# Patient Record
Sex: Female | Born: 1935 | Race: White | Hispanic: No | Marital: Married | Smoking: Never smoker
Health system: Southern US, Community
[De-identification: ages and names within clinical notes are randomized; demographics above are authoritative.]

## PROBLEM LIST (undated history)

## (undated) DIAGNOSIS — I10 Essential (primary) hypertension: Secondary | ICD-10-CM

## (undated) DIAGNOSIS — N289 Disorder of kidney and ureter, unspecified: Secondary | ICD-10-CM

## (undated) DIAGNOSIS — K5790 Diverticulosis of intestine, part unspecified, without perforation or abscess without bleeding: Secondary | ICD-10-CM

## (undated) DIAGNOSIS — E119 Type 2 diabetes mellitus without complications: Secondary | ICD-10-CM

## (undated) HISTORY — PX: CARDIAC SURGERY: SHX584

## (undated) HISTORY — PX: ABDOMINAL HYSTERECTOMY: SHX81

---

## 2003-11-12 ENCOUNTER — Other Ambulatory Visit: Payer: Self-pay

## 2004-01-24 ENCOUNTER — Ambulatory Visit: Payer: Self-pay | Admitting: Internal Medicine

## 2004-03-26 ENCOUNTER — Encounter: Payer: Self-pay | Admitting: Internal Medicine

## 2004-04-10 ENCOUNTER — Encounter: Payer: Self-pay | Admitting: Internal Medicine

## 2004-12-02 ENCOUNTER — Ambulatory Visit: Payer: Self-pay | Admitting: Internal Medicine

## 2005-02-03 ENCOUNTER — Ambulatory Visit: Payer: Self-pay | Admitting: Gastroenterology

## 2005-03-26 ENCOUNTER — Ambulatory Visit: Payer: Self-pay | Admitting: Internal Medicine

## 2005-12-08 ENCOUNTER — Ambulatory Visit: Payer: Self-pay | Admitting: Internal Medicine

## 2006-09-22 ENCOUNTER — Ambulatory Visit: Payer: Self-pay | Admitting: Family Medicine

## 2007-01-07 ENCOUNTER — Ambulatory Visit: Payer: Self-pay | Admitting: Family Medicine

## 2007-07-21 ENCOUNTER — Ambulatory Visit: Payer: Self-pay | Admitting: Unknown Physician Specialty

## 2007-07-31 ENCOUNTER — Emergency Department: Payer: Self-pay | Admitting: Internal Medicine

## 2007-09-07 ENCOUNTER — Ambulatory Visit: Payer: Self-pay | Admitting: Unknown Physician Specialty

## 2008-01-13 ENCOUNTER — Ambulatory Visit: Payer: Self-pay | Admitting: Family Medicine

## 2008-01-31 ENCOUNTER — Ambulatory Visit: Payer: Self-pay | Admitting: Family Medicine

## 2008-03-17 ENCOUNTER — Inpatient Hospital Stay: Payer: Self-pay | Admitting: Internal Medicine

## 2008-03-30 ENCOUNTER — Ambulatory Visit: Payer: Self-pay | Admitting: Unknown Physician Specialty

## 2009-02-16 IMAGING — CT CT CERVICAL SPINE WITHOUT CONTRAST
3 series · 16 of 33 positions shown, 19 images · non-contrast
Comparison: none

REASON FOR EXAM: Fall
COMMENTS:

[Series 2: axial · axial · 0.30mm/px · z∈[+122,+240]mm · 8 of 81 slices shown, 10 images]
[im 7/81  soft-tissue]
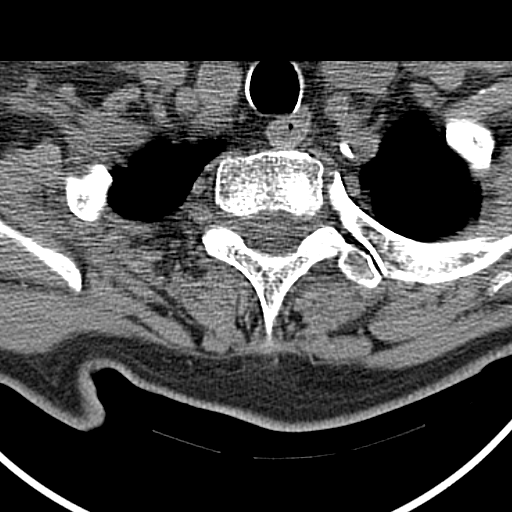
[im 7/81  bone]
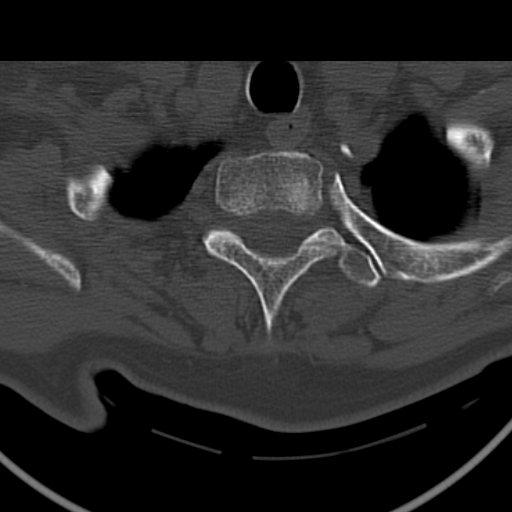
[im 19/81  bone]
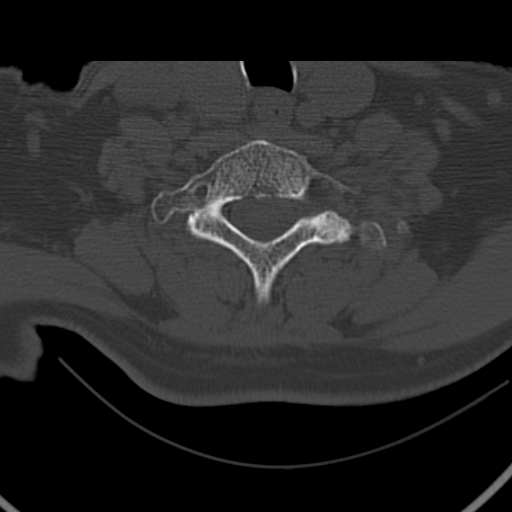
[im 25/81  bone]
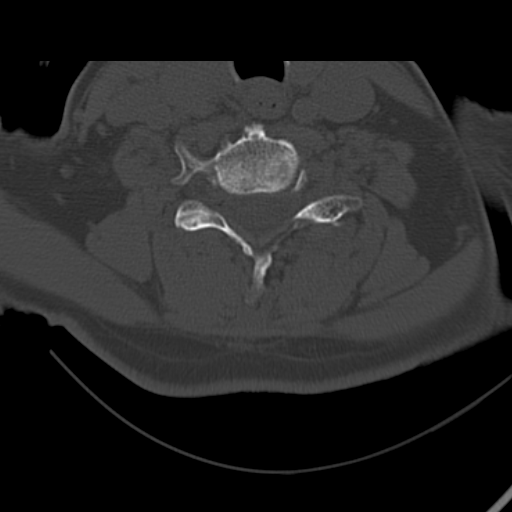
[im 37/81  bone]
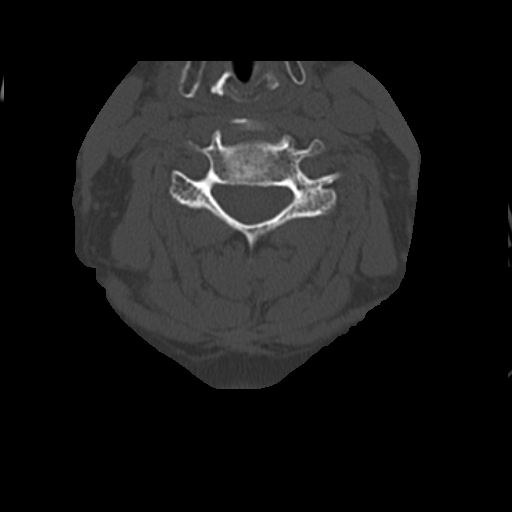
[im 44/81  soft-tissue]
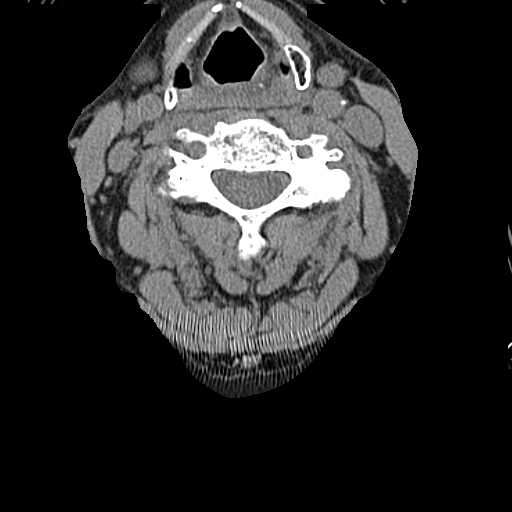
[im 44/81  bone]
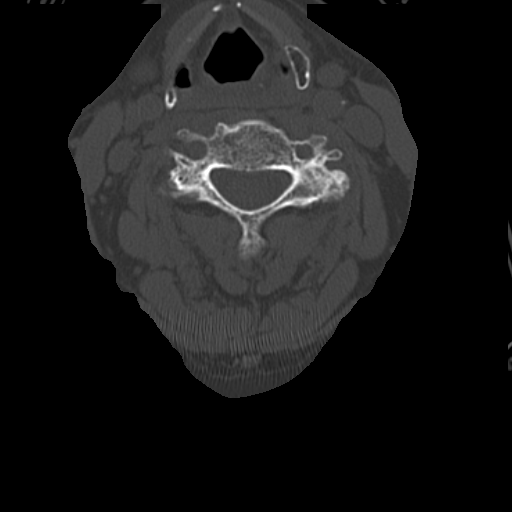
[im 56/81  bone]
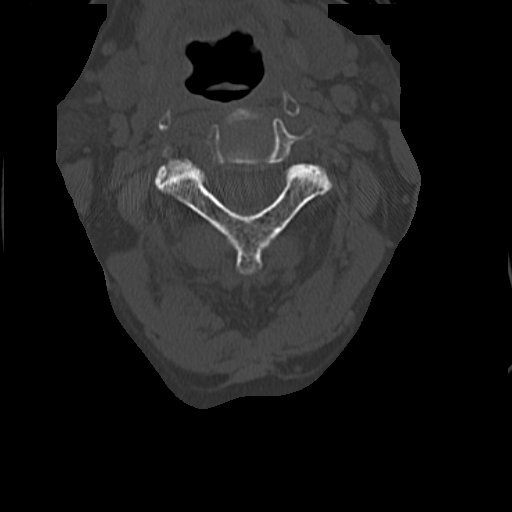
[im 62/81  bone]
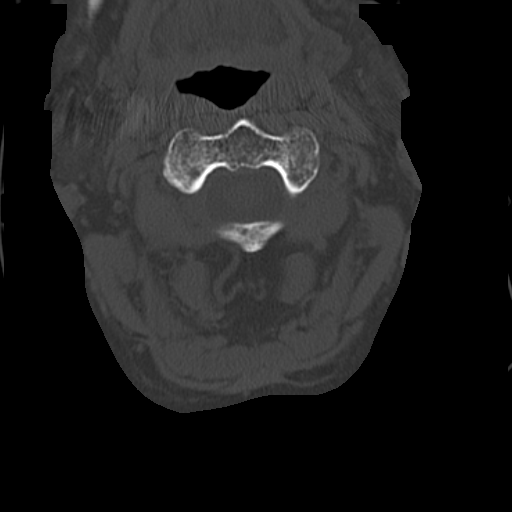
[im 74/81  bone]
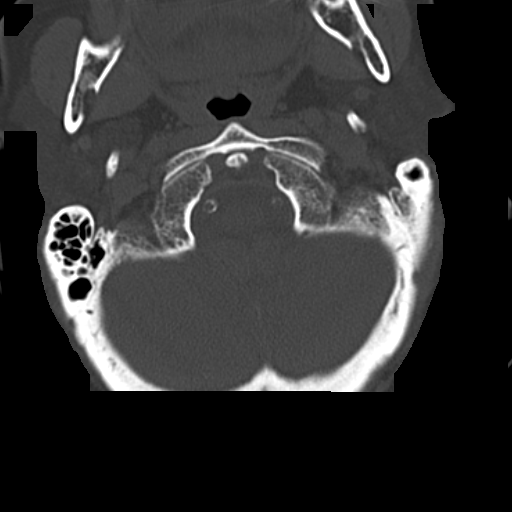

[Series 3: sagittal · sagittal · 0.33mm/px · 5 of 50 slices shown, 6 images]
[im 17/50  bone]
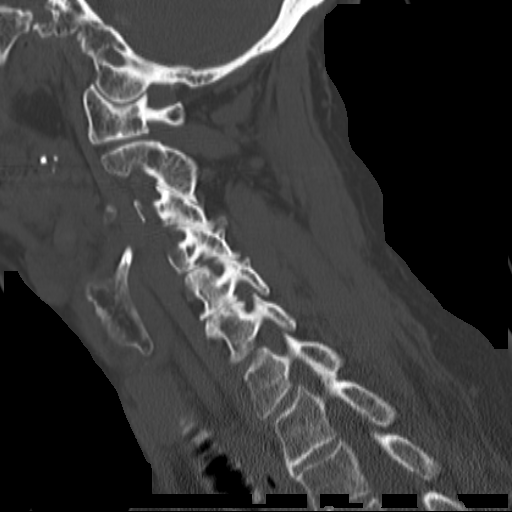
[im 21/50  bone]
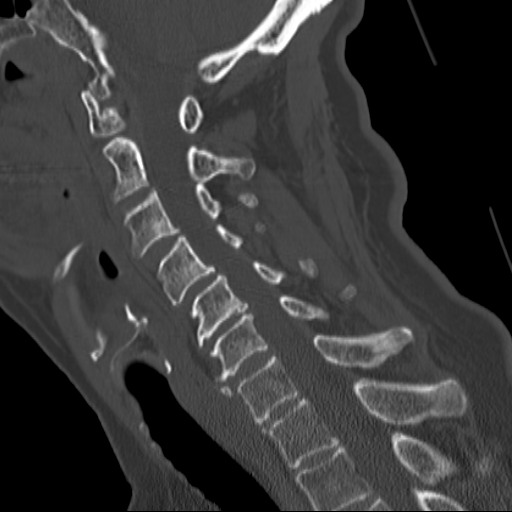
[im 25/50  soft-tissue]
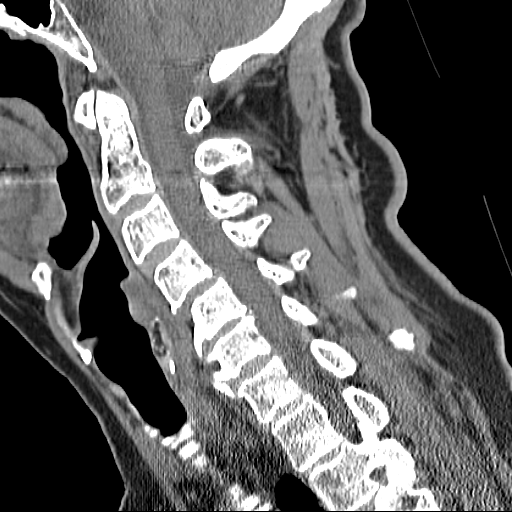
[im 25/50  bone]
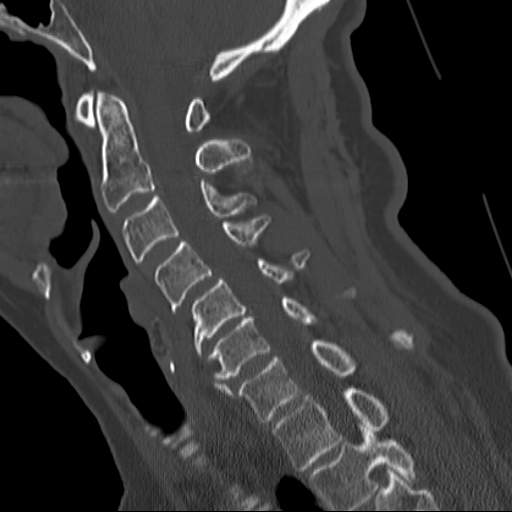
[im 29/50  bone]
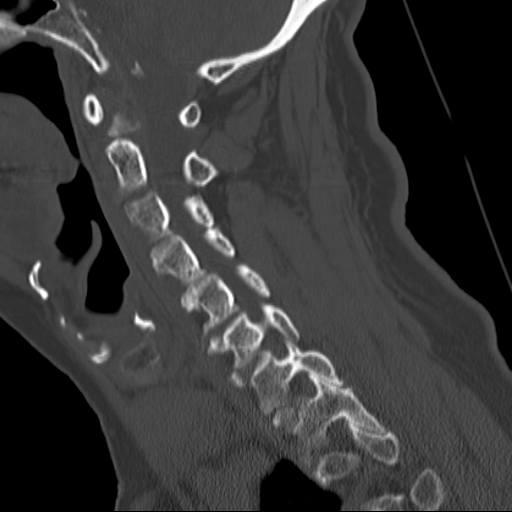
[im 33/50  bone]
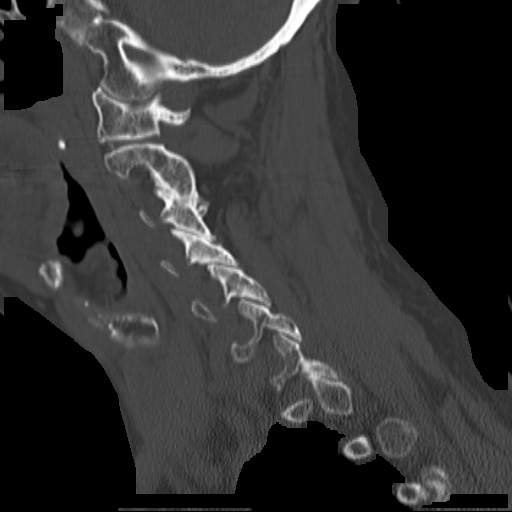

[Series 4: coronal · coronal · 0.33mm/px · 3 of 52 slices shown]
[im 11/52  bone]
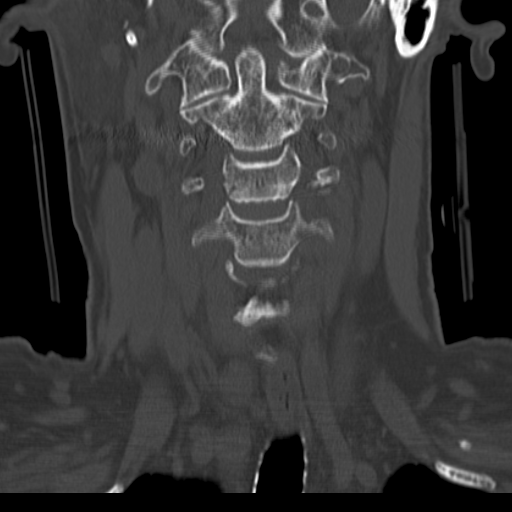
[im 21/52  bone]
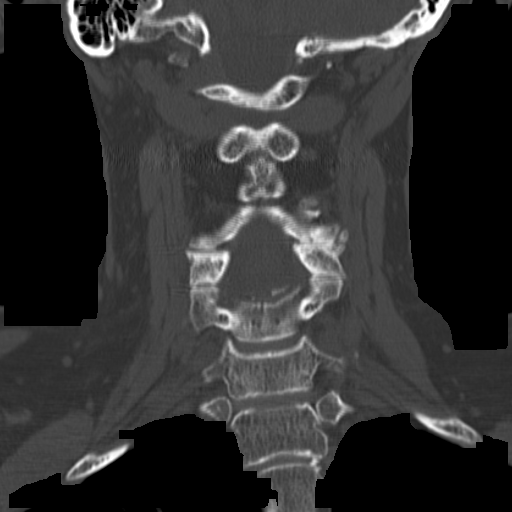
[im 31/52  bone]
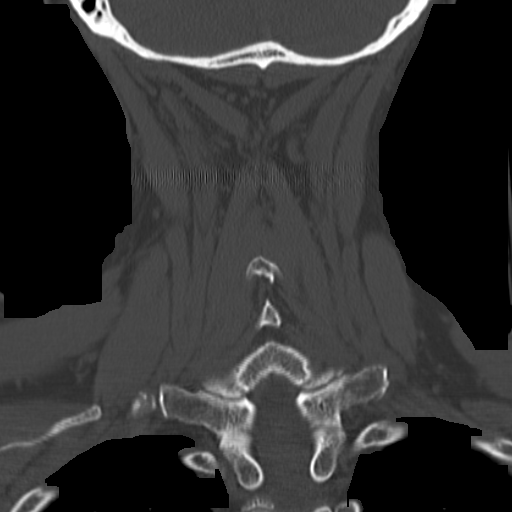

[16 of 33 positions shown; findings below may reference images not displayed]

PROCEDURE:     CT  - CT CERVICAL SPINE WO  - July 31, 2007 [DATE]

RESULT:     There does not appear to be evidence of acute fracture or
dislocation.  There is straightening of the normal cervical lordosis
possibly secondary to collar placement and/or muscle spasm.  Degenerative
change is appreciated within the mid cervical spine at the C5-6 and C6-7
levels.  There are areas of osteophytosis. No evidence of prevertebral soft
tissue swelling is appreciated.
IMPRESSION: 1.Degenerative changes without evidence of acute osseous abnormalities.

Dr. Sunbul of the Emergency Room was informed of these findings at the
time of the initial interpretation.

## 2009-02-16 IMAGING — CT CT MAXILLOFACIAL WITHOUT CONTRAST
1 series · 16 of 30 positions shown, 20 images · non-contrast
Comparison: none

REASON FOR EXAM: Fall
COMMENTS:

PROCEDURE:     CT  - CT MAXILLOFACIAL AREA WO  - July 31, 2007 [DATE]
RESULT:     There is no evidence of fracture, dislocation or malalignment.
The paranasal sinuses are patent.  The ostiomeatal complexes demonstrate
concha bullosa within the superior turbinate on the RIGHT.

[Series 2: facial 3.0 h60f · axial · 0.34mm/px · z∈[+194,+356]mm · 16 of 58 slices shown, 20 images]
[im 2/58  brain]
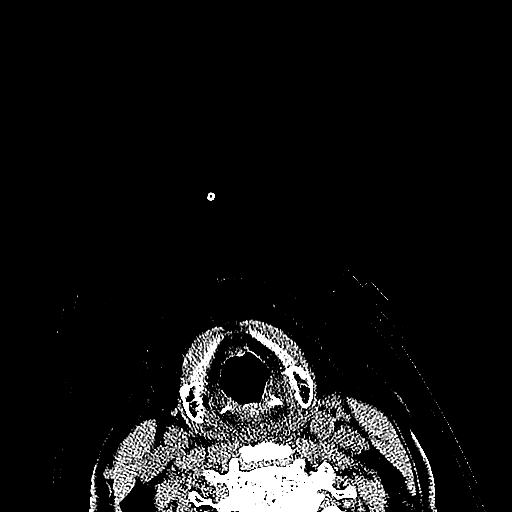
[im 2/58  bone]
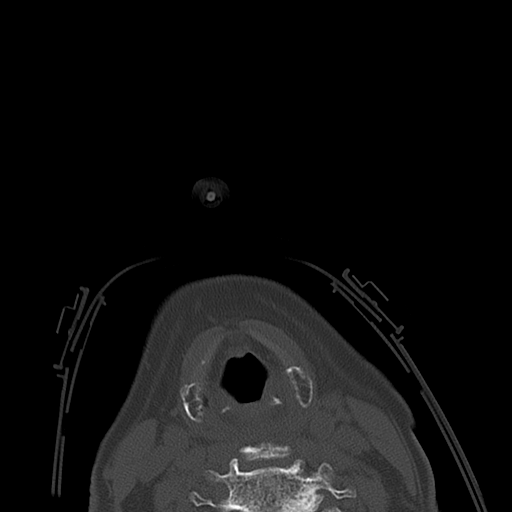
[im 6/58  bone]
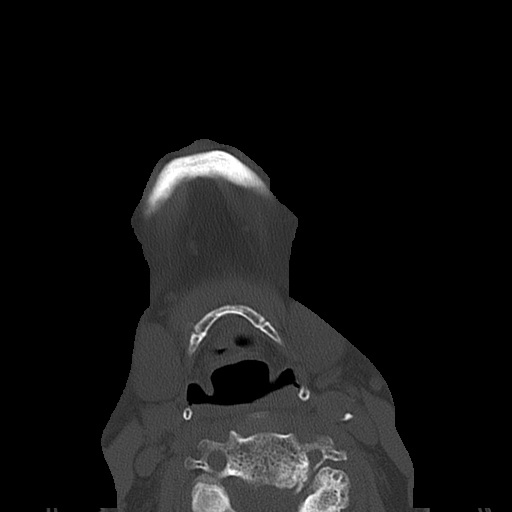
[im 10/58  bone]
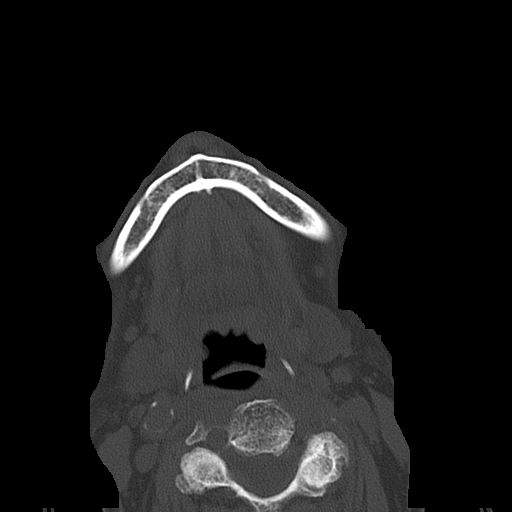
[im 14/58  bone]
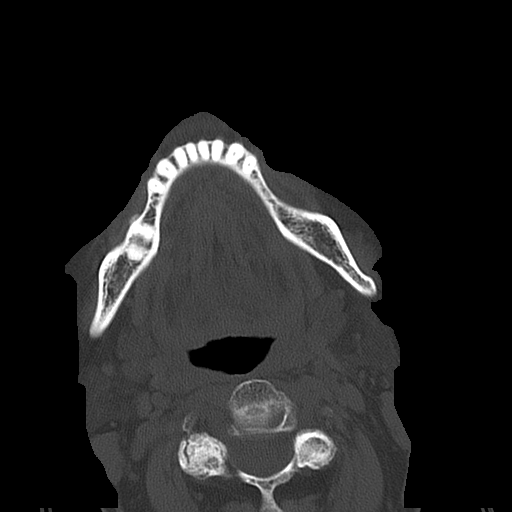
[im 16/58  brain]
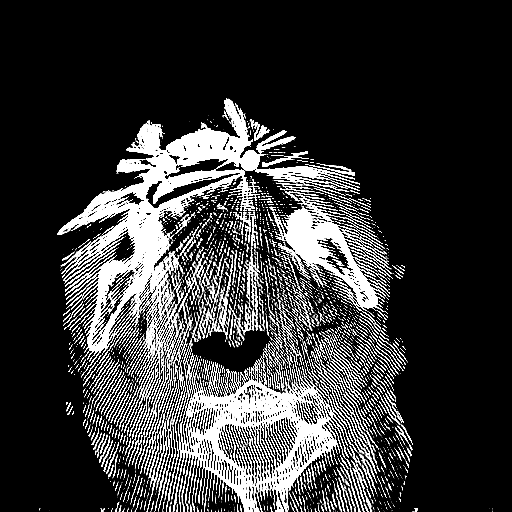
[im 16/58  bone]
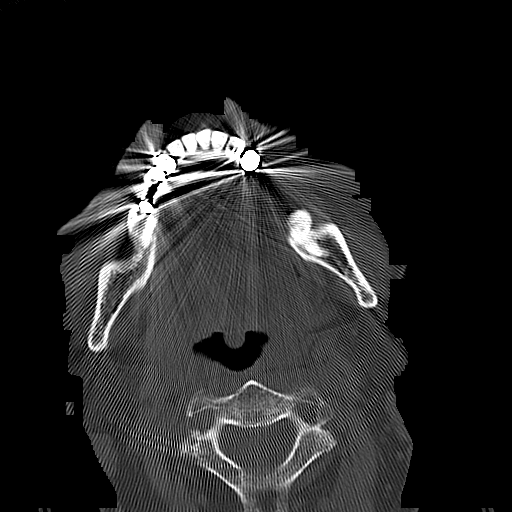
[im 20/58  bone]
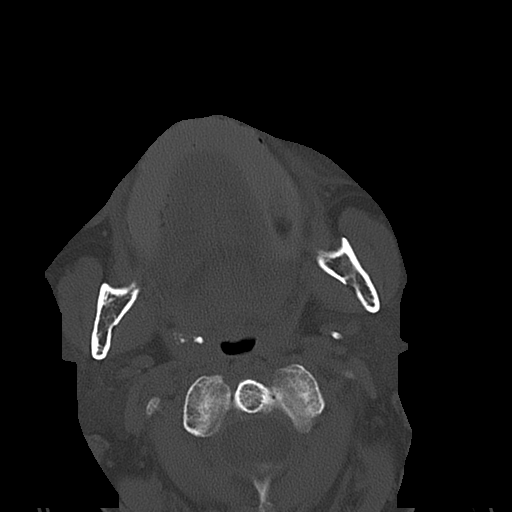
[im 24/58  bone]
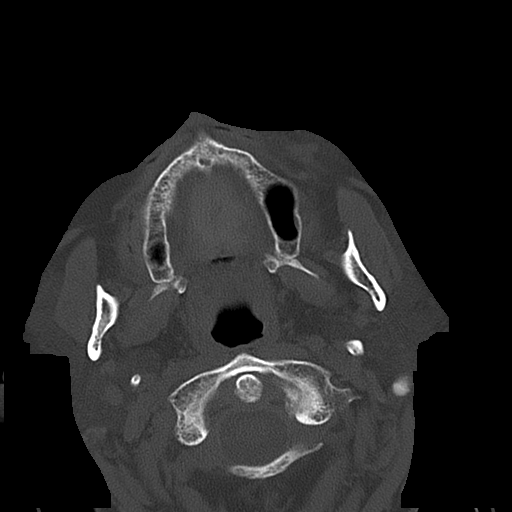
[im 28/58  bone]
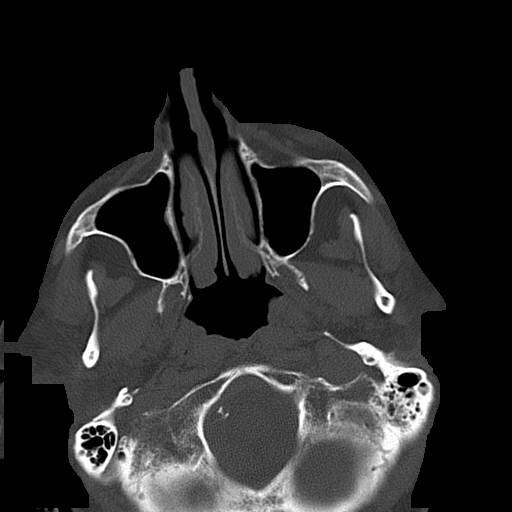
[im 30/58  brain]
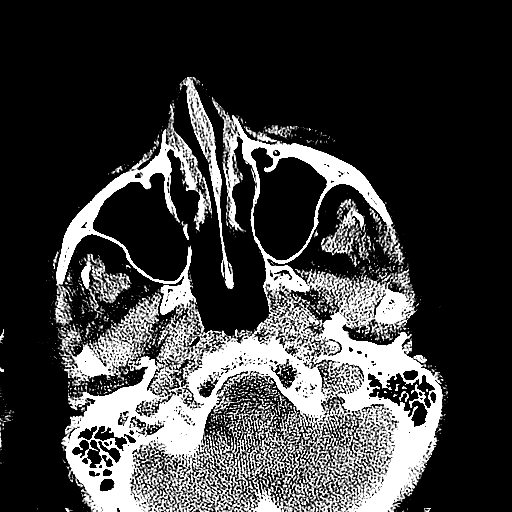
[im 30/58  bone]
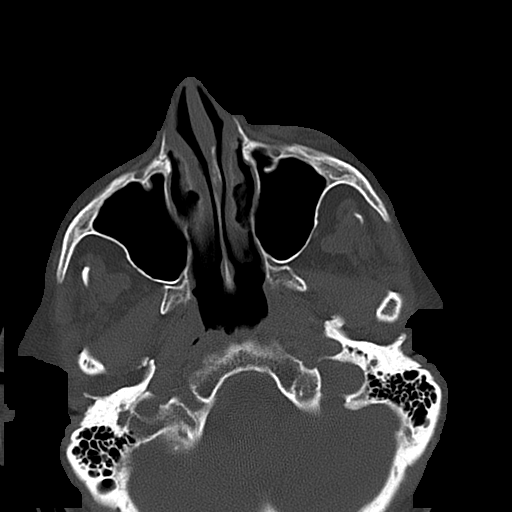
[im 34/58  bone]
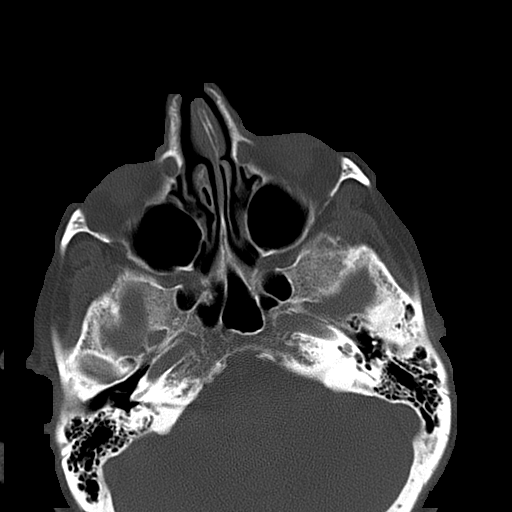
[im 38/58  bone]
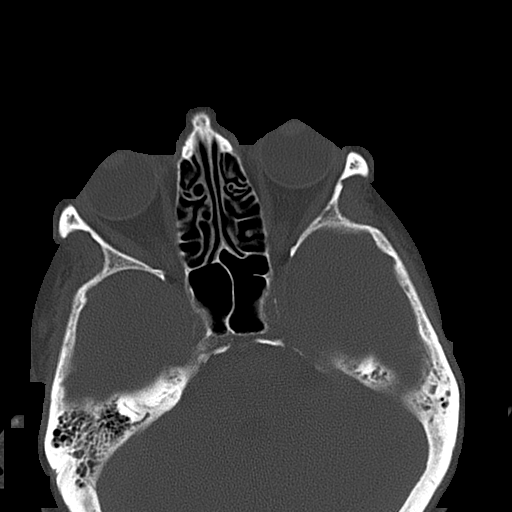
[im 42/58  bone]
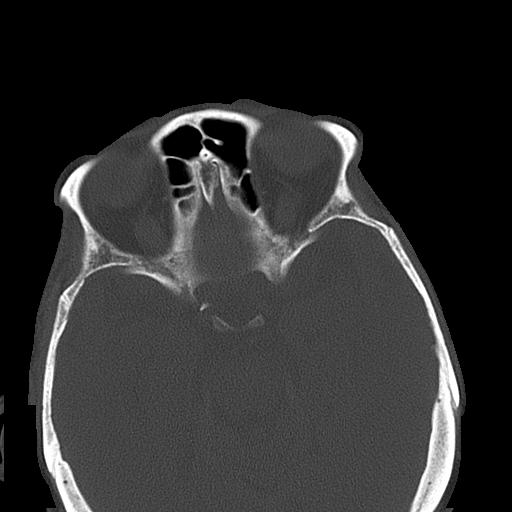
[im 44/58  brain]
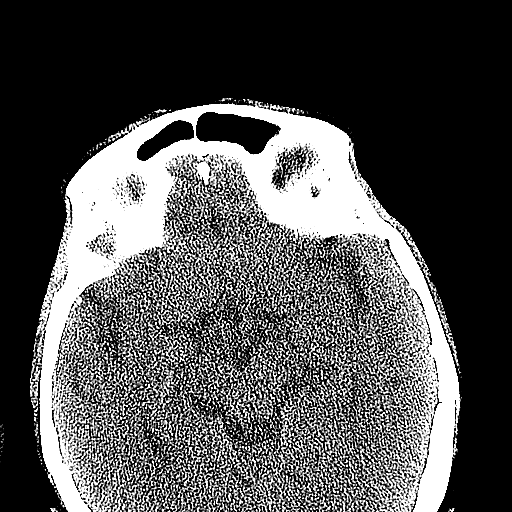
[im 44/58  bone]
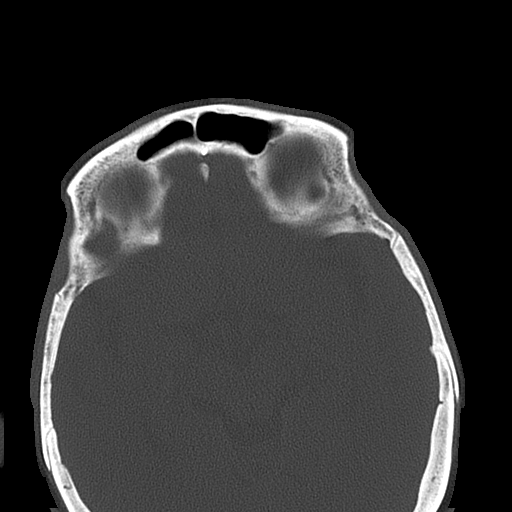
[im 48/58  bone]
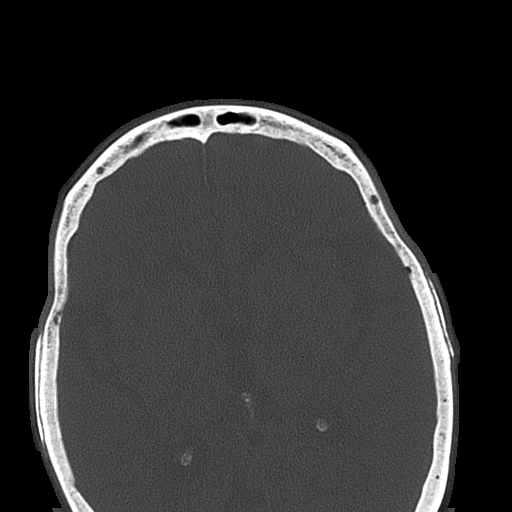
[im 52/58  bone]
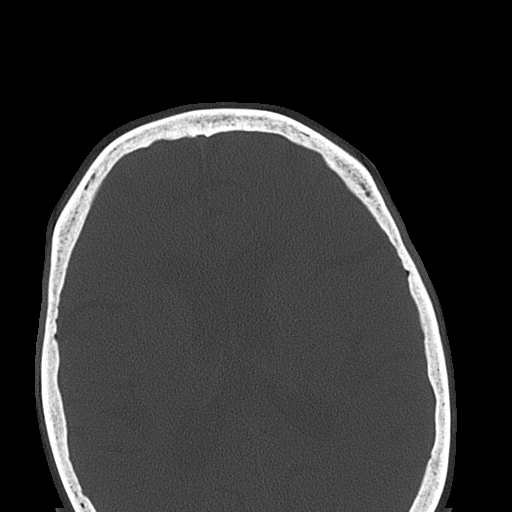
[im 56/58  bone]
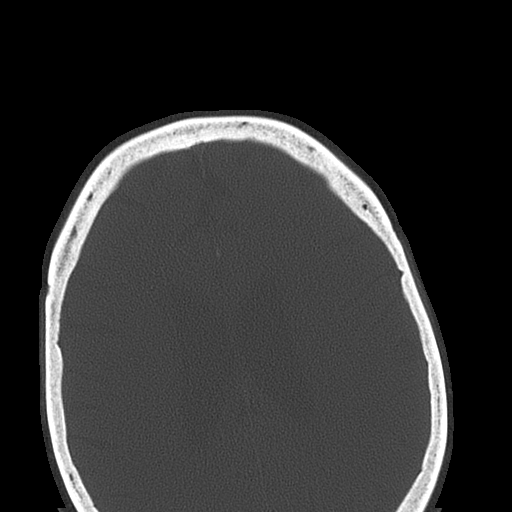

[16 of 30 positions shown; findings below may reference images not displayed]

IMPRESSION: 1.No evidence of acute osseous abnormalities.

Dr. Severjanen of the Emergency Room was informed of these findings at the
time of the initial interpretation.

## 2016-09-09 ENCOUNTER — Encounter: Payer: Self-pay | Admitting: Emergency Medicine

## 2016-09-09 ENCOUNTER — Emergency Department
Admission: EM | Admit: 2016-09-09 | Discharge: 2016-09-10 | Disposition: A | Discharge status: Home / Self Care | Payer: Medicare Other | Attending: Emergency Medicine | Admitting: Emergency Medicine

## 2016-09-09 DIAGNOSIS — Z87440 Personal history of urinary (tract) infections: Secondary | ICD-10-CM | POA: Diagnosis not present

## 2016-09-09 DIAGNOSIS — E871 Hypo-osmolality and hyponatremia: Secondary | ICD-10-CM

## 2016-09-09 DIAGNOSIS — I10 Essential (primary) hypertension: Secondary | ICD-10-CM | POA: Diagnosis not present

## 2016-09-09 DIAGNOSIS — N3090 Cystitis, unspecified without hematuria: Secondary | ICD-10-CM | POA: Insufficient documentation

## 2016-09-09 DIAGNOSIS — E119 Type 2 diabetes mellitus without complications: Secondary | ICD-10-CM | POA: Diagnosis not present

## 2016-09-09 DIAGNOSIS — R300 Dysuria: Secondary | ICD-10-CM | POA: Diagnosis not present

## 2016-09-09 DIAGNOSIS — R1030 Lower abdominal pain, unspecified: Secondary | ICD-10-CM | POA: Diagnosis present

## 2016-09-09 HISTORY — DX: Essential (primary) hypertension: I10

## 2016-09-09 HISTORY — DX: Disorder of kidney and ureter, unspecified: N28.9

## 2016-09-09 HISTORY — DX: Diverticulosis of intestine, part unspecified, without perforation or abscess without bleeding: K57.90

## 2016-09-09 HISTORY — DX: Type 2 diabetes mellitus without complications: E11.9

## 2016-09-09 LAB — COMPREHENSIVE METABOLIC PANEL
ALBUMIN: 3.9 g/dL (ref 3.5–5.0)
ALK PHOS: 56 U/L (ref 38–126)
ALT: 15 U/L (ref 14–54)
ANION GAP: 9 (ref 5–15)
AST: 20 U/L (ref 15–41)
BUN: 12 mg/dL (ref 6–20)
CALCIUM: 9 mg/dL (ref 8.9–10.3)
CHLORIDE: 93 mmol/L — AB (ref 101–111)
CO2: 26 mmol/L (ref 22–32)
Creatinine, Ser: 1.04 mg/dL — ABNORMAL HIGH (ref 0.44–1.00)
GFR calc non Af Amer: 48 mL/min — ABNORMAL LOW (ref >60)
GFR, EST AFRICAN AMERICAN: 56 mL/min — AB (ref >60)
GLUCOSE: 123 mg/dL — AB (ref 65–99)
POTASSIUM: 4.5 mmol/L (ref 3.5–5.1)
SODIUM: 128 mmol/L — AB (ref 135–145)
Total Bilirubin: 0.8 mg/dL (ref 0.3–1.2)
Total Protein: 6.9 g/dL (ref 6.5–8.1)

## 2016-09-09 LAB — URINALYSIS, COMPLETE (UACMP) WITH MICROSCOPIC
Bilirubin Urine: NEGATIVE
GLUCOSE, UA: NEGATIVE mg/dL
Ketones, ur: NEGATIVE mg/dL
Nitrite: POSITIVE — AB
PROTEIN: NEGATIVE mg/dL
Specific Gravity, Urine: 1.006 (ref 1.005–1.030)
pH: 5 (ref 5.0–8.0)

## 2016-09-09 LAB — CBC
HEMATOCRIT: 35.6 % (ref 35.0–47.0)
HEMOGLOBIN: 12.1 g/dL (ref 12.0–16.0)
MCH: 29.9 pg (ref 26.0–34.0)
MCHC: 33.9 g/dL (ref 32.0–36.0)
MCV: 88.2 fL (ref 80.0–100.0)
Platelets: 261 10*3/uL (ref 150–440)
RBC: 4.03 MIL/uL (ref 3.80–5.20)
RDW: 13.9 % (ref 11.5–14.5)
WBC: 7.5 10*3/uL (ref 3.6–11.0)

## 2016-09-09 MED ORDER — DEXTROSE 5 % IV SOLN
1.0000 g | Freq: Once | INTRAVENOUS | Status: AC
  Administered 2016-09-10: 1 g via INTRAVENOUS
  Filled 2016-09-09: qty 10

## 2016-09-09 MED ORDER — SODIUM CHLORIDE 0.9 % IV BOLUS (SEPSIS)
1000.0000 mL | Freq: Once | INTRAVENOUS | Status: AC
  Administered 2016-09-10: 1000 mL via INTRAVENOUS

## 2016-09-09 NOTE — ED Triage Notes (Signed)
Patient to ER for c/o suprapubic pain. Was diagnosed with [facility] yesterday at [facility], but not feeling any better. Was prescribed Cipro. Did not have any blood work done yesterday, just UA. Patient also c/o right and mid lower back pain.

## 2016-09-10 DIAGNOSIS — N3090 Cystitis, unspecified without hematuria: Secondary | ICD-10-CM | POA: Diagnosis not present

## 2016-09-10 MED ORDER — CEFPODOXIME PROXETIL 100 MG PO TABS: 100.0000 mg | ORAL_TABLET | Freq: Two times a day (BID) | ORAL | 0 refills | Status: AC

## 2016-09-10 NOTE — ED Provider Notes (Signed)
[facility] Emergency Department Provider Note  ____________________________________________  Time seen: Approximately 12:41 AM  I have reviewed the triage vital signs and the nursing notes.   HISTORY  Chief Complaint Abdominal Pain    HPI Adrienne Gomez is a 83 y.o. female who complains of suprapubic pain and dysuria for the past week. She has a history of recurrent urinary tract infections, previously managed by urology. She had not had one for a long time and then 3 weeks ago she started having the symptoms again. She started taking her Macrobid that she has on hand from her urologist for when the symptoms appear to be recurrent, but after taking it she did not really experience any relief of her symptoms. She went to urgent care and tried Bactrim without relief. She went to [facility] clinic yesterday and was started on Cipro 500 mg once daily. They did a urine culture which has not resulted yet. She is not feeling any better since yesterday after taking one dose of Cipro. She comes to the ED for further assistance with her urinary tract infection.  She denies any generalized weakness gait instability loss of balance or coordination. No falls. No paresthesias.     Past Medical History:  Diagnosis Date  . Diabetes mellitus without complication (HCC)   . Diverticulosis   . Hypertension   . Renal disorder    40% function     There are no active problems to display for this patient.    Past Surgical History:  Procedure Laterality Date  . ABDOMINAL HYSTERECTOMY    . CARDIAC SURGERY     Quad bypass     Prior to Admission medications   Medication Sig Start Date End Date Taking? Authorizing Provider  cefpodoxime (VANTIN) 100 MG tablet Take 1 tablet (100 mg total) by mouth 2 (two) times daily. 09/10/16   Sharman Cheek, MD     Allergies Iodine   No family history on file.  Social History Social History  Substance Use Topics  . Smoking  status: Never Smoker  . Smokeless tobacco: Never Used  . Alcohol use No    Review of Systems  Constitutional:  Positive chills.  ENT:   No sore throat. No rhinorrhea. Cardiovascular:   No chest pain or syncope. Respiratory:   No dyspnea or cough. Gastrointestinal:   Positive suprapubic pain without vomiting and diarrhea.  Musculoskeletal:   Negative for focal pain or swelling All other systems reviewed and are negative except as documented above in ROS and HPI.  ____________________________________________   PHYSICAL EXAM:  VITAL SIGNS: ED Triage Vitals  Enc Vitals Group     BP 09/09/16 2300 140/89     Pulse Rate 09/09/16 2300 63     Resp --      Temp --      Temp src --      SpO2 09/09/16 2300 97 %     Weight 09/09/16 1943 144 lb (65.3 kg)     Height 09/09/16 1943 5\' 6"  (1.676 m)     Head Circumference --      Peak Flow --      Pain Score 09/09/16 1943 7     Pain Loc --      Pain Edu? --      Excl. in GC? --     Vital signs reviewed, nursing assessments reviewed.   Constitutional:   Alert and oriented. Well appearing and in no distress. Eyes:   No scleral icterus.  EOMI. No nystagmus. No conjunctival pallor. PERRL. ENT   Head:   Normocephalic and atraumatic.   Nose:   No congestion/rhinnorhea.    Mouth/Throat:   MMM, no pharyngeal erythema. No peritonsillar mass.    Neck:   No meningismus. Full ROM Hematological/Lymphatic/Immunilogical:   No cervical lymphadenopathy. Cardiovascular:   RRR. Symmetric bilateral radial and DP pulses.  No murmurs.  Respiratory:   Normal respiratory effort without tachypnea/retractions. Breath sounds are clear and equal bilaterally. No wheezes/rales/rhonchi. Gastrointestinal:   Soft with mild suprapubic tenderness. Non distended. There is no CVA tenderness.  No rebound, rigidity, or guarding. Genitourinary:   deferred Musculoskeletal:   Normal range of motion in all extremities. No joint effusions.  No lower extremity  tenderness.  No edema. Neurologic:   Normal speech and language.  Motor grossly intact. No gross focal neurologic deficits are appreciated.  Skin:    Skin is warm, dry and intact. No rash noted.  No petechiae, purpura, or bullae.  ____________________________________________    LABS (pertinent positives/negatives) (all labs ordered are listed, but only abnormal results are displayed) Labs Reviewed  COMPREHENSIVE METABOLIC PANEL - Abnormal; Notable for the following:       Result Value   Sodium 128 (*)    Chloride 93 (*)    Glucose, Bld 123 (*)    Creatinine, Ser 1.04 (*)    GFR calc non Af Amer 48 (*)    GFR calc Af Amer 56 (*)    All other components within normal limits  URINALYSIS, COMPLETE (UACMP) WITH MICROSCOPIC - Abnormal; Notable for the following:    Color, Urine AMBER (*)    APPearance HAZY (*)    Hgb urine dipstick SMALL (*)    Nitrite POSITIVE (*)    Leukocytes, UA MODERATE (*)    Bacteria, UA MANY (*)    Squamous Epithelial / LPF 0-5 (*)    All other components within normal limits  URINE CULTURE  CBC   ____________________________________________   EKG    ____________________________________________    RADIOLOGY  No results found.  ____________________________________________   PROCEDURES Procedures  ____________________________________________   INITIAL IMPRESSION / ASSESSMENT AND PLAN / ED COURSE  Pertinent labs & imaging results that were available during my care of the patient were reviewed by me and considered in my medical decision making (see chart for details).  Patient presents with suprapubic pain. Has a nitrite positive UTI. Labs otherwise show a slightly low sodium level of 128 but without any significant symptoms. IV fluids given for hydration and correction of mild hyponatremia. IV ceftriaxone given. I'll continue the patient on Vantin and send another urine culture. No evidence of pyelonephritis or sepsis, vital signs are  normal today.      ____________________________________________   FINAL CLINICAL IMPRESSION(S) / ED DIAGNOSES  Final diagnoses:  Cystitis  Hyponatremia      New Prescriptions   CEFPODOXIME (VANTIN) 100 MG TABLET    Take 1 tablet (100 mg total) by mouth 2 (two) times daily.     Portions of this note were generated with dragon dictation software. Dictation errors may occur despite best attempts at proofreading.    Sharman CheekStafford, Chardai Gangemi, MD 09/10/16 [phone]

## 2016-09-10 NOTE — ED Notes (Signed)

## 2016-09-10 NOTE — ED Notes (Signed)
Awaiting pt fluids to finish to d/c.

## 2016-09-12 LAB — URINE CULTURE: Culture: >=100000 — AB

## 2016-09-13 NOTE — Progress Notes (Signed)
ED Antimicrobial Stewardship Positive Culture Follow Up   Adrienne Gomez is an 83 y.o. female who presented to [facility] 09[facility]/2016 with a chief complaint of  Chief Complaint  Patient presents with  . Abdominal Pain   Suprapubic pain and dysuria. Hx of recurrent UTIs, DM. Patient was tx for UTI 08/31/16 with Bactrim, then given cipro at clinic right before this ER visit. Notes state that patient had started taking Nitrofurantoin when symptoms started (has Rx from her urologist).  Recent Results (from the past 720 hour(s))  Urine Culture     Status: Abnormal   Collection Time: 09/09/16  7:47 PM  Result Value Ref Range Status   Specimen Description URINE, RANDOM  Final   Special Requests NONE  Final   Culture (A)  Final    >=100,000 COLONIES/mL ESCHERICHIA COLI Confirmed Extended Spectrum Beta-Lactamase Producer (ESBL) Performed at [facility], [address] [place], [place] [address]    Report Status 09/12/2016 FINAL  Final   Organism ID, Bacteria ESCHERICHIA COLI (A)  Final      Susceptibility   Escherichia coli - MIC*    AMPICILLIN >=32 RESISTANT Resistant     CEFAZOLIN >=64 RESISTANT Resistant     CEFTRIAXONE >=64 RESISTANT Resistant     CIPROFLOXACIN >=4 RESISTANT Resistant     GENTAMICIN >=16 RESISTANT Resistant     IMIPENEM <=0.25 SENSITIVE Sensitive     NITROFURANTOIN <=16 SENSITIVE Sensitive     TRIMETH/SULFA >=320 RESISTANT Resistant     AMPICILLIN/SULBACTAM >=32 RESISTANT Resistant     PIP/TAZO 16 SENSITIVE Sensitive     Extended ESBL POSITIVE Resistant     * >=100,000 COLONIES/mL ESCHERICHIA COLI    [x]  Treated with Vantin (cefpodoxime), organism resistant to prescribed antimicrobial  Called Patient [phone] and [place] has now returned home to [place].  Discussed with patient that she needs to follow up either with her Urologist or in an ER department for recheck of her Urine and possible IV antibiotic treatment with a Carbapenem for her ESBL E.coli  UTI. Patient still having discomfort. Explained that whatever Provider she sees can call [facility] ED [phone] for the culture results.  Discussed with ED Provider: Valrie HartWilliams, Adrienne Gomez   Boyce Keltner A 09/13/2016, 3:15 PM
